# Patient Record
Sex: Female | Born: 1960 | Race: Black or African American | Hispanic: No | Smoking: Current every day smoker
Health system: Southern US, Community
[De-identification: ages and names within clinical notes are randomized; demographics above are authoritative.]

## PROBLEM LIST (undated history)

## (undated) DIAGNOSIS — M797 Fibromyalgia: Secondary | ICD-10-CM

## (undated) DIAGNOSIS — K449 Diaphragmatic hernia without obstruction or gangrene: Secondary | ICD-10-CM

## (undated) DIAGNOSIS — K219 Gastro-esophageal reflux disease without esophagitis: Secondary | ICD-10-CM

## (undated) HISTORY — DX: Diaphragmatic hernia without obstruction or gangrene: K44.9

## (undated) HISTORY — DX: Gastro-esophageal reflux disease without esophagitis: K21.9

## (undated) HISTORY — DX: Fibromyalgia: M79.7

---

## 2007-01-14 ENCOUNTER — Encounter: Admission: RE | Admit: 2007-01-14 | Discharge: 2007-01-14 | Payer: Self-pay | Admitting: Internal Medicine

## 2007-04-05 ENCOUNTER — Ambulatory Visit (HOSPITAL_COMMUNITY): Admission: RE | Admit: 2007-04-05 | Discharge: 2007-04-05 | Payer: Self-pay | Admitting: *Deleted

## 2007-04-05 ENCOUNTER — Encounter (INDEPENDENT_AMBULATORY_CARE_PROVIDER_SITE_OTHER): Payer: Self-pay | Admitting: *Deleted

## 2007-06-08 ENCOUNTER — Ambulatory Visit (HOSPITAL_COMMUNITY): Admission: RE | Admit: 2007-06-08 | Discharge: 2007-06-08 | Payer: Self-pay | Admitting: Internal Medicine

## 2007-09-14 ENCOUNTER — Emergency Department (HOSPITAL_COMMUNITY): Admission: EM | Admit: 2007-09-14 | Discharge: 2007-09-15 | Payer: Self-pay | Admitting: Emergency Medicine

## 2007-11-26 ENCOUNTER — Encounter: Admission: RE | Admit: 2007-11-26 | Discharge: 2007-11-26 | Payer: Self-pay | Admitting: *Deleted

## 2008-03-19 ENCOUNTER — Emergency Department (HOSPITAL_COMMUNITY): Admission: EM | Admit: 2008-03-19 | Discharge: 2008-03-19 | Payer: Self-pay | Admitting: Emergency Medicine

## 2008-06-08 ENCOUNTER — Ambulatory Visit (HOSPITAL_COMMUNITY): Admission: RE | Admit: 2008-06-08 | Discharge: 2008-06-08 | Payer: Self-pay | Admitting: Internal Medicine

## 2008-08-09 ENCOUNTER — Encounter: Admission: RE | Admit: 2008-08-09 | Discharge: 2008-08-09 | Payer: Self-pay | Admitting: Internal Medicine

## 2008-09-04 ENCOUNTER — Emergency Department (HOSPITAL_COMMUNITY): Admission: EM | Admit: 2008-09-04 | Discharge: 2008-09-05 | Payer: Self-pay | Admitting: *Deleted

## 2009-05-08 IMAGING — CT CT ABDOMEN W/ CM
2 of 5 series · 17 of 46 positions shown, 19 images · IV contrast (READICAT/WATER & [ID] OMNI 300)
Comparison: None.

CLINICAL DATA: Left upper quadrant epigastric abdominal pain 4 months.  Weight loss with constipation.  Prior hysterectomy and appendectomy. 
ABDOMEN CT WITH CONTRAST:
TECHNIQUE: Multidetector CT imaging of the abdomen was performed following the standard protocol during bolus administration of intravenous contrast.
Contrast:  100 cc Omnipaque 300
TECHNIQUE: Multidetector CT imaging of the pelvis was performed following the standard protocol during bolus administration of intravenous contrast.

[Series 3: routine abdomen · axial · 0.64mm/px · z∈[-297,+28]mm · 14 of 74 slices shown, 16 images]
[im 5/74  soft-tissue]
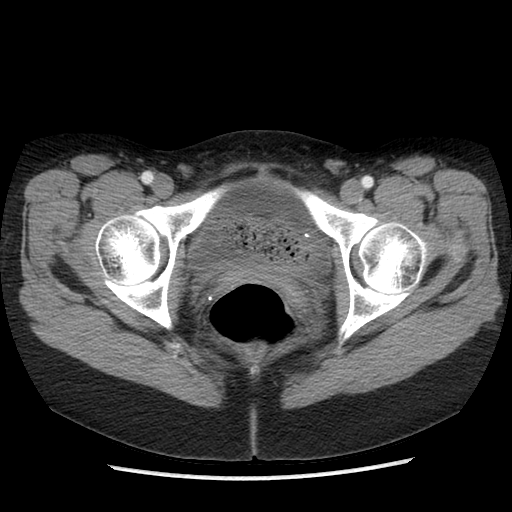
[im 5/74  bone]
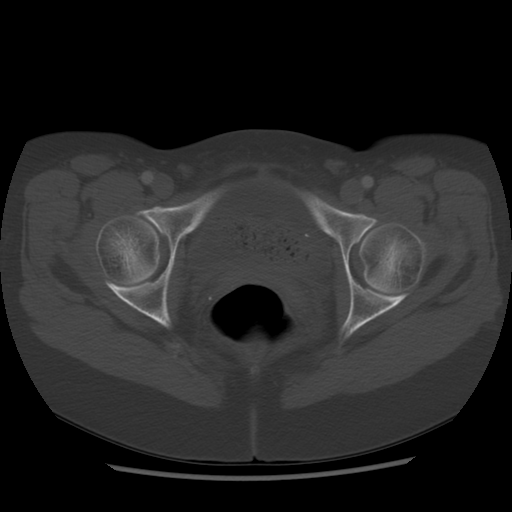
[im 9/74  soft-tissue]
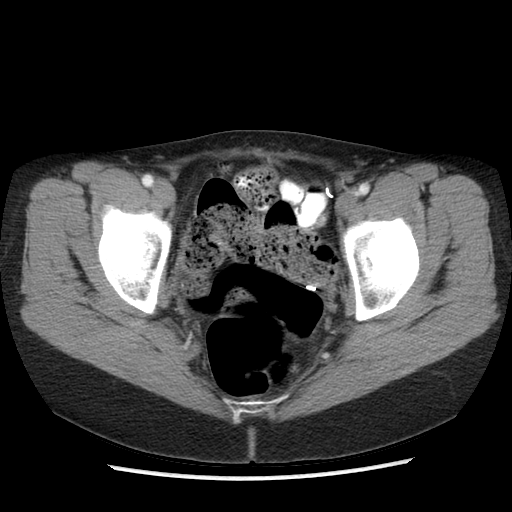
[im 13/74  soft-tissue]
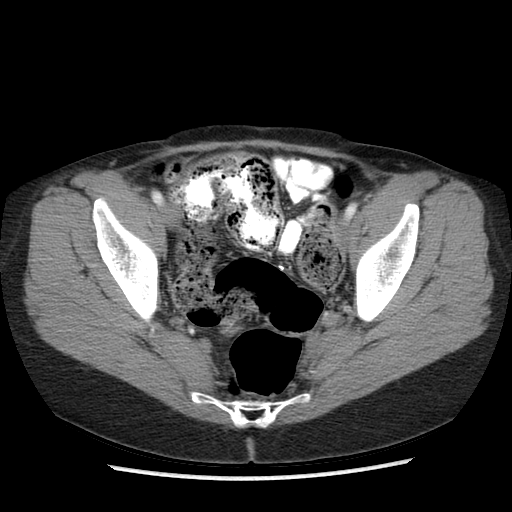
[im 22/74  soft-tissue]
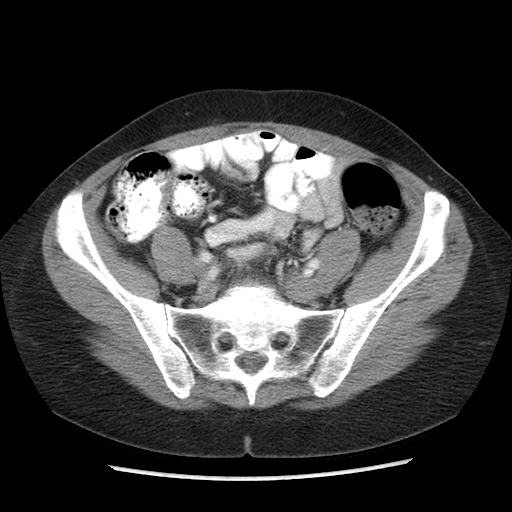
[im 26/74  soft-tissue]
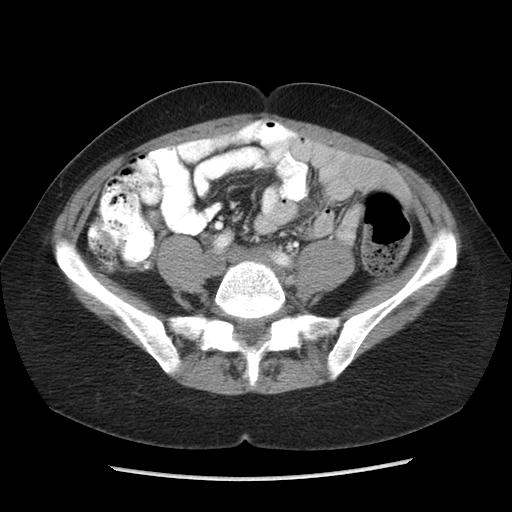
[im 31/74  soft-tissue]
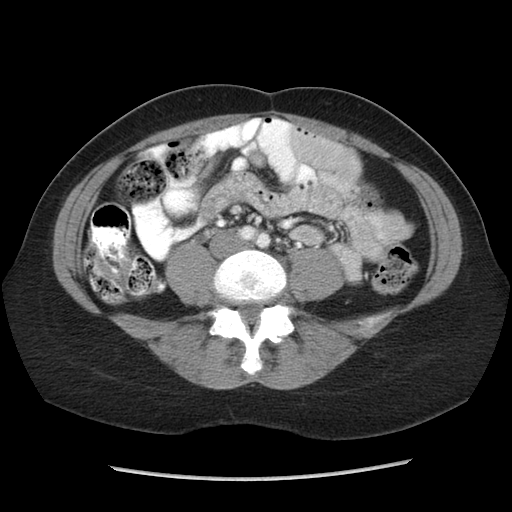
[im 35/74  soft-tissue]
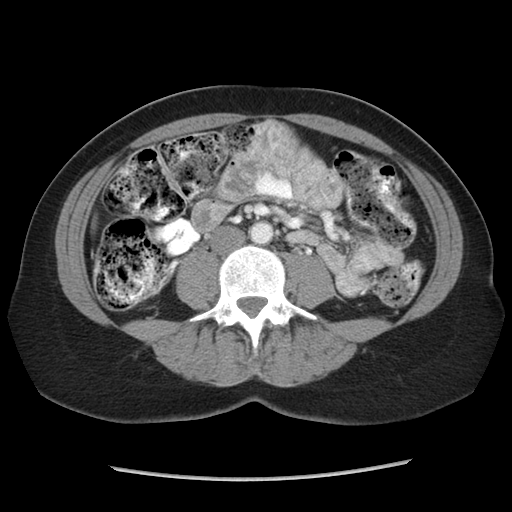
[im 39/74  soft-tissue]
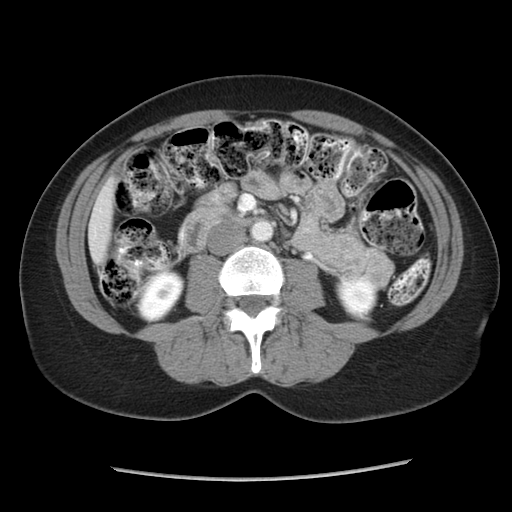
[im 43/74  soft-tissue]
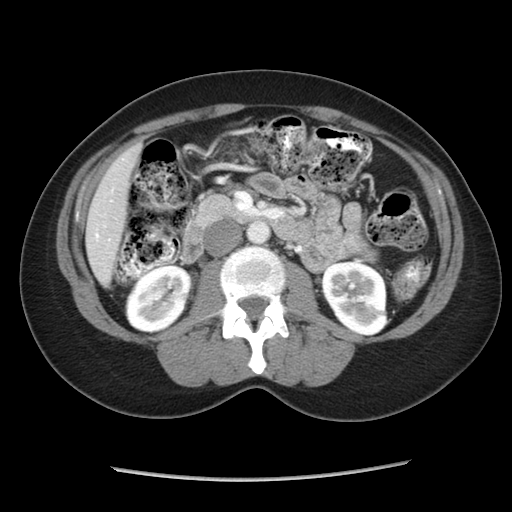
[im 43/74  bone]
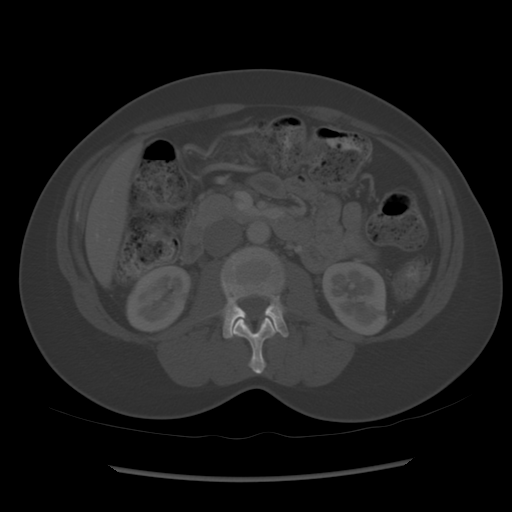
[im 48/74  soft-tissue]
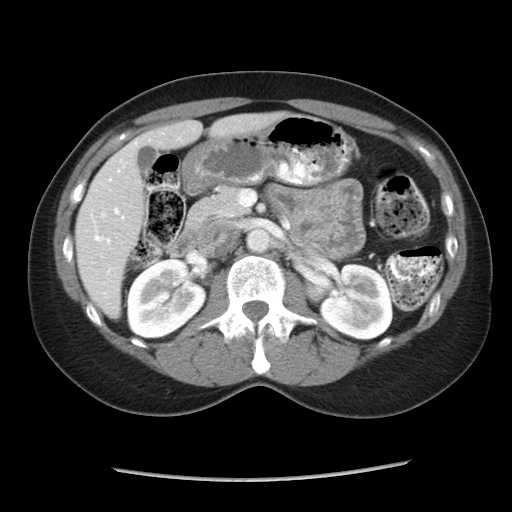
[im 56/74  soft-tissue]
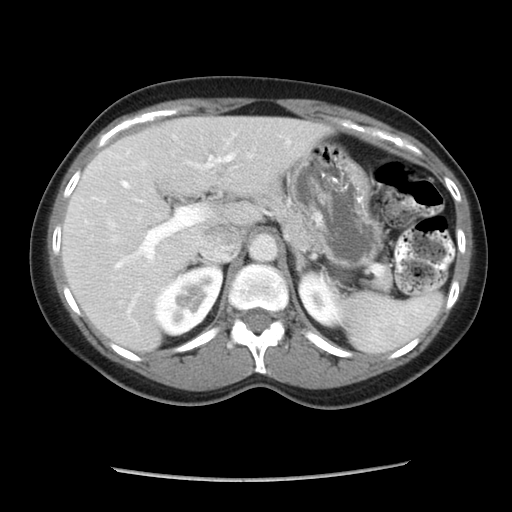
[im 61/74  soft-tissue]
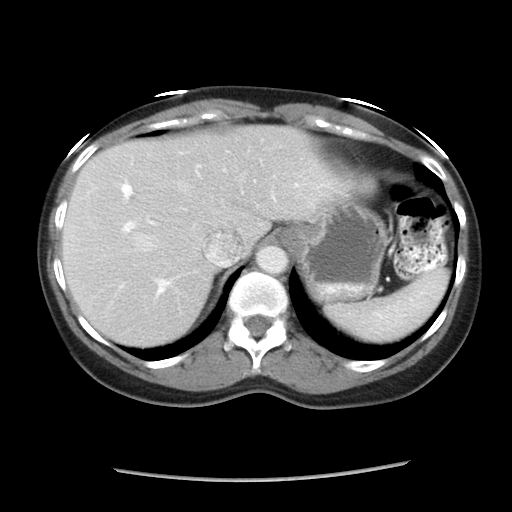
[im 65/74  soft-tissue]
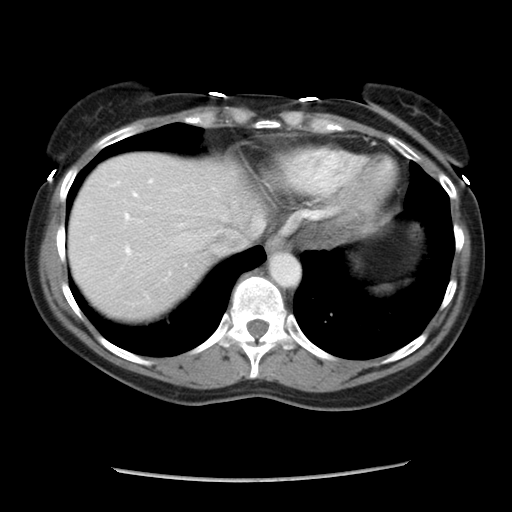
[im 69/74  soft-tissue]
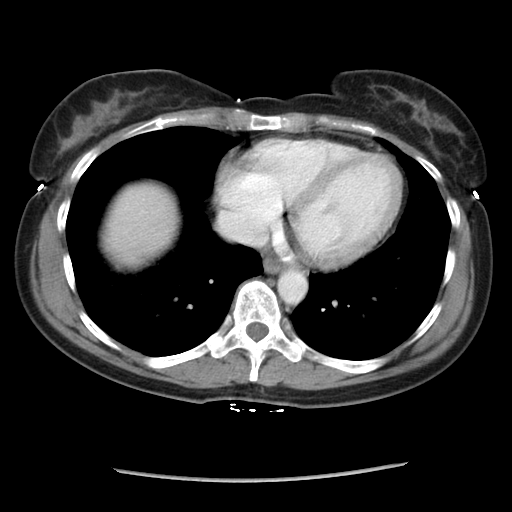

[Series 602: sagittal body · sagittal · 0.82mm/px · 3 of 133 slices shown]
[im 45/133  soft-tissue]
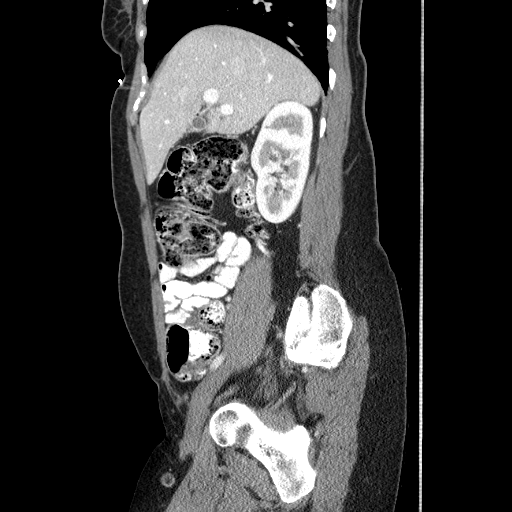
[im 59/133  soft-tissue]
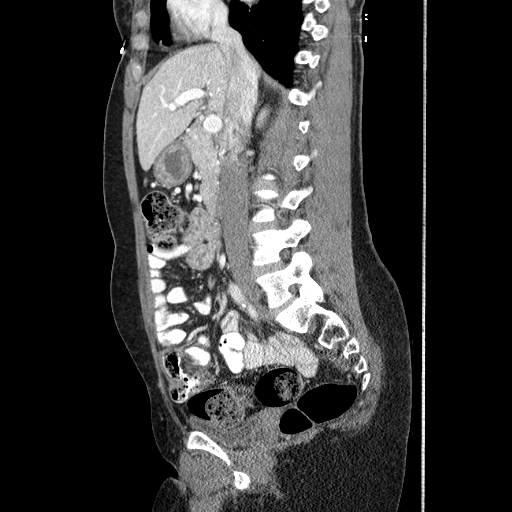
[im 74/133  soft-tissue]
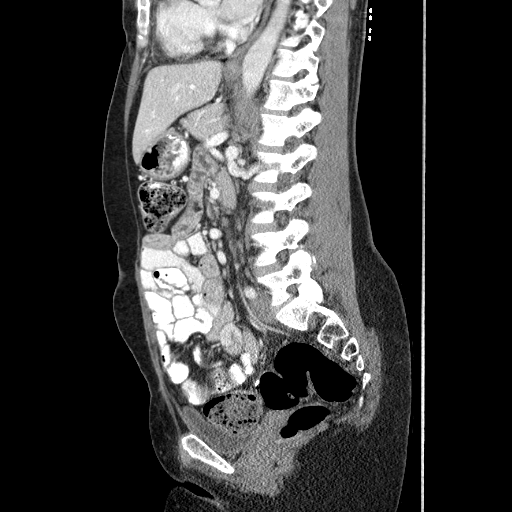

[17 of 46 positions shown; findings below may reference images not displayed]

FINDINGS: Lung bases are essentially clear with normal heart size.  Constipation findings are seen without inflammatory bowel nor intestinal obstructive findings.  Minimal atheromatous vascular calcification of abdominal aorta of normal caliber noted.  Duplicated right renal collecting system and proximal ureter seen.  Remaining abdominal organs appear normal without inflammation, free fluid, nor adenopathy.   Benign-appearing sclerosis is seen at the anterior cortex L-2 vertebra measuring 13 mm long x 4 mm AP x 5 mm wide.
IMPRESSION: 1.  Constipation findings without inflammatory bowel nor intestinal obstructive findings. 
2.  Minimal atheromatous vascular calcification normal caliber abdominal aorta. 
3.  Duplicated right renal collecting system and ureter. 
4.  Otherwise no significant abnormality. 
PELVIS CT WITH CONTRAST:
FINDINGS: Constipation findings are seen without inflammatory nor obstructive intestinal findings.  Patient is postappendectomy and hysterectomy by history and CT exam.  Remaining pelvic organs appear normal without inflammation, free fluid, nor adenopathy.
IMPRESSION: 1.  Postappendectomy and hysterectomy.
2.  Constipation findings. 
3.  Otherwise negative.

## 2010-02-13 ENCOUNTER — Emergency Department (HOSPITAL_COMMUNITY): Admission: EM | Admit: 2010-02-13 | Discharge: 2010-02-13 | Payer: Self-pay | Admitting: Family Medicine

## 2010-05-20 ENCOUNTER — Other Ambulatory Visit
Admission: RE | Admit: 2010-05-20 | Discharge: 2010-05-20 | Payer: Self-pay | Source: Home / Self Care | Admitting: Family Medicine

## 2010-07-18 LAB — DIFFERENTIAL
Basophils Absolute: 0 10*3/uL (ref 0.0–0.1)
Basophils Relative: 0 % (ref 0–1)
Neutro Abs: 4.6 10*3/uL (ref 1.7–7.7)
Neutrophils Relative %: 58 % (ref 43–77)

## 2010-07-18 LAB — COMPREHENSIVE METABOLIC PANEL
Alkaline Phosphatase: 78 U/L (ref 39–117)
BUN: 4 mg/dL — ABNORMAL LOW (ref 6–23)
CO2: 30 mEq/L (ref 19–32)
Chloride: 107 mEq/L (ref 96–112)
Glucose, Bld: 91 mg/dL (ref 70–99)
Potassium: 3.9 mEq/L (ref 3.5–5.1)
Total Bilirubin: 0.5 mg/dL (ref 0.3–1.2)

## 2010-07-18 LAB — POCT URINALYSIS DIPSTICK
Glucose, UA: NEGATIVE mg/dL
Hgb urine dipstick: NEGATIVE
Nitrite: NEGATIVE
Protein, ur: NEGATIVE mg/dL
Urobilinogen, UA: 0.2 mg/dL (ref 0.0–1.0)

## 2010-07-18 LAB — CBC
HCT: 42.1 % (ref 36.0–46.0)
MCV: 87.5 fL (ref 78.0–100.0)
RBC: 4.81 MIL/uL (ref 3.87–5.11)
RDW: 13.7 % (ref 11.5–15.5)
WBC: 8.1 10*3/uL (ref 4.0–10.5)

## 2010-07-18 LAB — POCT H PYLORI SCREEN: H. PYLORI SCREEN, POC: NEGATIVE

## 2010-08-19 ENCOUNTER — Other Ambulatory Visit (HOSPITAL_COMMUNITY): Payer: Self-pay | Admitting: Family Medicine

## 2010-08-19 DIAGNOSIS — Z1231 Encounter for screening mammogram for malignant neoplasm of breast: Secondary | ICD-10-CM

## 2010-08-23 ENCOUNTER — Ambulatory Visit (HOSPITAL_COMMUNITY)
Admission: RE | Admit: 2010-08-23 | Discharge: 2010-08-23 | Disposition: A | Discharge status: Home / Self Care | Payer: BLUE CROSS/BLUE SHIELD | Source: Ambulatory Visit | Attending: Family Medicine | Admitting: Family Medicine

## 2010-08-23 DIAGNOSIS — Z1231 Encounter for screening mammogram for malignant neoplasm of breast: Secondary | ICD-10-CM | POA: Insufficient documentation

## 2010-09-17 NOTE — Op Note (Signed)
Ashley Gould, FAITH               ACCOUNT NO.:  [id number]   MEDICAL RECORD NO.:  [id number]          PATIENT TYPE:  AMB   LOCATION:  ENDO                         FACILITY:  [facility]   PHYSICIAN:  Georgiana Spinner, M.D.    DATE OF BIRTH:  February 26, 1961   DATE OF PROCEDURE:  04/05/2007  DATE OF DISCHARGE:                               OPERATIVE REPORT   PROCEDURE:  Upper endoscopy.   INDICATIONS:  GERD.  History elsewhere of Barrett's esophagus.   ANESTHESIA:  1. Fentanyl 50 mcg.  2. Versed 4 mg.   PROCEDURE:  With the patient mildly sedated in the left lateral  decubitus position, the Pentax videoscopic endoscope was inserted into  the mouth and passed under direct vision through the esophagus, which  appeared normal.  There was a hiatal hernia seen, but no evidence of  Barrett's that I could tell.  Photographs and biopsy of the  squamocolumnar junction were taken.  We entered into the stomach.  The  fundus, body, and antrum appeared normal.  Duodenal bulb showed some  changes that were photographed and biopsied as well.  The second portion  of the duodenum appeared normal.  From this point, the endoscope was  slowly withdrawn, taking circumferential views of the duodenal mucosa  until the endoscope had been pulled back into the stomach and placed in  retroflexion to view the stomach from below. The endoscope was  straightened and withdrawn, taking circumferential views of the  remaining gastric and esophageal mucosa.  The patient's vital signs and  pulse oximeter remained stable.  The patient tolerated the procedure  well, without apparent complication.   FINDINGS:  Hiatal hernia, with no evidence of Barrett's esophagus to my  view, and changes in the duodenal bulb as noted.  Await biopsy report.  The patient will call me for results and follow up with me as needed as  an outpatient.           ______________________________  Georgiana Spinner, M.D.     GMO/MEDQ  D:  04/05/2007  T:   04/05/2007  Job:  [id number]

## 2011-04-01 ENCOUNTER — Other Ambulatory Visit: Payer: Self-pay | Admitting: Family Medicine

## 2011-04-03 ENCOUNTER — Ambulatory Visit
Admission: RE | Admit: 2011-04-03 | Discharge: 2011-04-03 | Disposition: A | Discharge status: Home / Self Care | Payer: BC Managed Care – PPO | Source: Ambulatory Visit | Attending: Family Medicine | Admitting: Family Medicine

## 2011-04-03 MED ORDER — IOHEXOL 300 MG/ML  SOLN
100.0000 mL | Freq: Once | INTRAMUSCULAR | Status: AC | PRN
  Administered 2011-04-03: 100 mL via INTRAVENOUS

## 2011-08-18 ENCOUNTER — Emergency Department (HOSPITAL_COMMUNITY): Payer: BC Managed Care – PPO

## 2011-08-18 ENCOUNTER — Emergency Department (HOSPITAL_COMMUNITY)
Admission: EM | Admit: 2011-08-18 | Discharge: 2011-08-19 | Disposition: A | Discharge status: Home / Self Care | Payer: BC Managed Care – PPO | Attending: Emergency Medicine | Admitting: Emergency Medicine

## 2011-08-18 ENCOUNTER — Encounter (HOSPITAL_COMMUNITY): Payer: Self-pay | Admitting: *Deleted

## 2011-08-18 DIAGNOSIS — R110 Nausea: Secondary | ICD-10-CM | POA: Insufficient documentation

## 2011-08-18 DIAGNOSIS — R1011 Right upper quadrant pain: Secondary | ICD-10-CM | POA: Insufficient documentation

## 2011-08-18 DIAGNOSIS — R079 Chest pain, unspecified: Secondary | ICD-10-CM | POA: Insufficient documentation

## 2011-08-18 DIAGNOSIS — R1013 Epigastric pain: Secondary | ICD-10-CM | POA: Insufficient documentation

## 2011-08-18 DIAGNOSIS — R55 Syncope and collapse: Secondary | ICD-10-CM | POA: Insufficient documentation

## 2011-08-18 DIAGNOSIS — R61 Generalized hyperhidrosis: Secondary | ICD-10-CM | POA: Insufficient documentation

## 2011-08-18 DIAGNOSIS — R42 Dizziness and giddiness: Secondary | ICD-10-CM | POA: Insufficient documentation

## 2011-08-18 DIAGNOSIS — R10819 Abdominal tenderness, unspecified site: Secondary | ICD-10-CM | POA: Insufficient documentation

## 2011-08-18 DIAGNOSIS — F17200 Nicotine dependence, unspecified, uncomplicated: Secondary | ICD-10-CM | POA: Insufficient documentation

## 2011-08-18 LAB — COMPREHENSIVE METABOLIC PANEL
AST: 17 U/L (ref 0–37)
Albumin: 4.2 g/dL (ref 3.5–5.2)
Alkaline Phosphatase: 85 U/L (ref 39–117)
CO2: 30 mEq/L (ref 19–32)
Chloride: 103 mEq/L (ref 96–112)
Creatinine, Ser: 0.82 mg/dL (ref 0.50–1.10)
GFR calc non Af Amer: 80 mL/min — ABNORMAL LOW (ref >90)
Potassium: 4 mEq/L (ref 3.5–5.1)
Total Bilirubin: 0.3 mg/dL (ref 0.3–1.2)

## 2011-08-18 LAB — CBC
HCT: 43.1 % (ref 36.0–46.0)
Hemoglobin: 13.8 g/dL (ref 12.0–15.0)
WBC: 8 10*3/uL (ref 4.0–10.5)

## 2011-08-18 LAB — POCT I-STAT TROPONIN I: Troponin i, poc: 0 ng/mL (ref 0.00–0.08)

## 2011-08-18 LAB — DIFFERENTIAL
Basophils Absolute: 0 10*3/uL (ref 0.0–0.1)
Lymphocytes Relative: 44 % (ref 12–46)
Monocytes Absolute: 0.4 10*3/uL (ref 0.1–1.0)
Monocytes Relative: 5 % (ref 3–12)
Neutro Abs: 3.9 10*3/uL (ref 1.7–7.7)
Neutrophils Relative %: 49 % (ref 43–77)

## 2011-08-18 LAB — CK TOTAL AND CKMB (NOT AT ARMC): CK, MB: 1.1 ng/mL (ref 0.3–4.0)

## 2011-08-18 MED ORDER — ONDANSETRON 4 MG PO TBDP
8.0000 mg | ORAL_TABLET | Freq: Once | ORAL | Status: AC
  Administered 2011-08-18: 8 mg via ORAL

## 2011-08-18 NOTE — ED Provider Notes (Signed)
History     CSN: [id number]  Arrival date & time 08/18/11  1825   None     Chief Complaint  Patient presents with  . Chest Pain    (Consider location/radiation/quality/duration/timing/severity/associated sxs/prior treatment) Patient is a 52 y.o. female presenting with chest pain. The history is provided by the patient. No language interpreter was used.  Chest Pain The chest pain began 2 days ago. Chest pain occurs intermittently. The chest pain is worsening. At its most intense, the pain is at 8/10. The pain is currently at 5/10. The severity of the pain is moderate. The quality of the pain is described as pressure-like. The pain radiates to the epigastrium. Chest pain is worsened by eating. Primary symptoms include abdominal pain, nausea and dizziness. Pertinent negatives for primary symptoms include no fever, no shortness of breath, no cough, no wheezing and no vomiting.  Dizziness also occurs with nausea and diaphoresis. Dizziness does not occur with vomiting or weakness.   Associated symptoms include diaphoresis and near-syncope.  Pertinent negatives for associated symptoms include no lower extremity edema and no weakness.  Her past medical history is significant for anxiety/panic attacks.  Pertinent negatives for past medical history include no aneurysm, no aortic aneurysm, no aortic dissection, no CAD, no cancer, no COPD, no CHF, no diabetes, no DVT, no hypertension, no MI, no mitral valve prolapse, no pacemaker, no PE, no recent injury, no rheumatic fever, no seizures, no sickle cell disease, no sleep apnea, no strokes, no thyroid problem, no TIA and no valve disorder.  Her family medical history is significant for diabetes in family, hyperlipidemia in family and hypertension in family.  Pertinent negatives for family medical history include: no CAD in family, no heart disease in family, no early MI in family, no PE in family, no sickle cell disease in family, no stroke in family, no  sudden death in family and no TIA in family.  Procedure history is positive for stress echo and exercise treadmill test.  Procedure history is negative for cardiac catheterization, echocardiogram, persantine thallium and stress thallium.    51 year old female complaining of right upper quadrant pain that radiates into her chest. States the pain has been on a daily basis for 2 weeks. States that the pain may last a few minutes or it can last up to an hour. States that she has had nausea with this pain no vomiting or shortness of breath. Describes the pain as a pressure. Patient is a smoker.  Patient's does still have her gallbladder. Refusing pain meds.  pmh fibromyalia and gerd.    History reviewed. No pertinent past medical history.  History reviewed. No pertinent past surgical history.  No family history on file.  History  Substance Use Topics  . Smoking status: Current Everyday Smoker  . Smokeless tobacco: Not on file  . Alcohol Use: No    OB History    Grav Para Term Preterm Abortions TAB SAB Ect Mult Living                  Review of Systems  Constitutional: Positive for diaphoresis. Negative for fever.  HENT: Negative.   Eyes: Negative.   Respiratory: Negative.  Negative for cough, shortness of breath and wheezing.   Cardiovascular: Positive for chest pain and near-syncope.  Gastrointestinal: Positive for nausea and abdominal pain. Negative for vomiting and abdominal distention.  Genitourinary: Negative for flank pain.  Neurological: Positive for dizziness. Negative for seizures, syncope, facial asymmetry, weakness, light-headedness and headaches.  Psychiatric/Behavioral: Negative.   All other systems reviewed and are negative.    Allergies  Sulfa antibiotics  Home Medications   Current Outpatient Rx  Name Route Sig Dispense Refill  . DULOXETINE HCL 30 MG PO CPEP Oral Take 30 mg by mouth daily.    Marland Kitchen ESOMEPRAZOLE MAGNESIUM 40 MG PO CPDR Oral Take 40 mg by mouth  daily before breakfast.      BP 146/88  Pulse 109  Temp(Src) 98.8 F (37.1 C) (Oral)  Resp 16  SpO2 98%  Physical Exam  Nursing note and vitals reviewed. Constitutional: She is oriented to person, place, and time. She appears well-developed and well-nourished.  HENT:  Head: Normocephalic and atraumatic.  Eyes: Conjunctivae and EOM are normal. Pupils are equal, round, and reactive to light.  Neck: Normal range of motion. Neck supple.  Cardiovascular: Normal rate, regular rhythm, normal heart sounds and intact distal pulses.   Pulmonary/Chest: Effort normal and breath sounds normal. No respiratory distress. She has no wheezes. She has no rales.  Abdominal: Soft. Bowel sounds are normal. She exhibits no distension. There is tenderness. There is no rebound.  Musculoskeletal: Normal range of motion. She exhibits no edema and no tenderness.  Neurological: She is alert and oriented to person, place, and time. She has normal reflexes.  Skin: Skin is warm and dry.  Psychiatric: She has a normal mood and affect.    ED Course  Procedures (including critical care time)  Labs Reviewed  COMPREHENSIVE METABOLIC PANEL - Abnormal; Notable for the following:    GFR calc non Af Amer 80 (*)    All other components within normal limits  LIPASE, BLOOD - Abnormal; Notable for the following:    Lipase 77 (*)    All other components within normal limits  CBC  DIFFERENTIAL  CK TOTAL AND CKMB  POCT I-STAT TROPONIN I   No results found.   No diagnosis found.    MDM  Will move to CDU awaiting u/s abdomen to r/o gallbladder dz.  Elevated liver enzymes with epigastric/chest pain.  Trop - x 2.  Refusing pain meds in ER.  Will follow up with Pcp tomorrow to have lipase rechecked.  Pain med and nausea meds given.  Will return if worse.  U/s - for cholecystitis.  Suspect early pancreatitis. Denies etoh.   Labs Reviewed  COMPREHENSIVE METABOLIC PANEL - Abnormal; Notable for the following:    GFR  calc non Af Amer 80 (*)    All other components within normal limits  LIPASE, BLOOD - Abnormal; Notable for the following:    Lipase 77 (*)    All other components within normal limits  CBC  DIFFERENTIAL  CK TOTAL AND CKMB  POCT I-STAT TROPONIN I  POCT I-STAT TROPONIN I  LAB REPORT - SCANNED          Remi Haggard, NP 08/19/11 1210

## 2011-08-18 NOTE — ED Notes (Signed)
Pt states that she has been having CP off and on for the past couple of days. Pt states that the pain is an 4/10 and center chest radiating to her back pt states that she is not nauseated or SOB or diaphoretic.

## 2011-08-18 NOTE — ED Notes (Signed)
The pt has had  Chest pain epigastric pain with lt posterior chest radiation for over one week

## 2011-08-19 LAB — POCT I-STAT TROPONIN I: Troponin i, poc: 0 ng/mL (ref 0.00–0.08)

## 2011-08-19 MED ORDER — ONDANSETRON HCL 4 MG PO TABS: 4.0000 mg | ORAL_TABLET | Freq: Four times a day (QID) | ORAL | Status: AC

## 2011-08-19 MED ORDER — HYDROCODONE-ACETAMINOPHEN 5-500 MG PO TABS: 1.0000 | ORAL_TABLET | Freq: Four times a day (QID) | ORAL | Status: AC | PRN

## 2011-08-19 NOTE — ED Notes (Signed)
Patient verbalized complete understanding of all the d/c home instructions 

## 2011-08-19 NOTE — Discharge Instructions (Signed)
Ms Crumpler the u/s of your abdomen does not show gallbladder disease.  Take the pain meds and nausea meds and follow up with your pcp tomorrow to have your lipase rechecked.  Your lipase in the ER was elevated at 77.  This could mean you have pancreatitis. Return to the ER for worsening pain or uncontrolled nausea and vomiting.  The EKG and heart labs were ok in the ER tonight.    Abdominal Pain Many things can cause belly (abdominal) pain. Most times, the belly pain is not dangerous. The amount of belly pain does not tell how serious the problem may be. Many cases of belly pain can be watched and treated at home. HOME CARE   Do not take medicines that help you go poop (laxatives) unless told to by your doctor.   Only take medicine as told by your doctor.   Eat or drink as told by your doctor. Your doctor will tell you if you should be on a special diet.  GET HELP RIGHT AWAY IF:   The pain does not go away.   You have a fever.   You keep throwing up (vomiting).   The pain changes and is only in the right or left part of the belly.   You have bloody or tarry looking poop.  MAKE SURE YOU:   Understand these instructions.   Will watch your condition.   Will get help right away if you are not doing well or get worse.  Document Released: 10/08/2007 Document Revised: 04/10/2011 Document Reviewed: 05/07/2009 [facility] Patient Information 2012 [place], [place].Abdominal Pain Many things can cause belly (abdominal) pain. Most times, the belly pain is not dangerous. The amount of belly pain does not tell how serious the problem may be. Many cases of belly pain can be watched and treated at home. HOME CARE   Do not take medicines that help you go poop (laxatives) unless told to by your doctor.   Only take medicine as told by your doctor.   Eat or drink as told by your doctor. Your doctor will tell you if you should be on a special diet.  GET HELP RIGHT AWAY IF:   The pain does not go  away.   You have a fever.   You keep throwing up (vomiting).   The pain changes and is only in the right or left part of the belly.   You have bloody or tarry looking poop.  MAKE SURE YOU:   Understand these instructions.   Will watch your condition.   Will get help right away if you are not doing well or get worse.  Document Released: 10/08/2007 Document Revised: 04/10/2011 Document Reviewed: 05/07/2009 [facility] Patient Information 2012 [place], [place].Abdominal Pain Many things can cause belly (abdominal) pain. Most times, the belly pain is not dangerous. The amount of belly pain does not tell how serious the problem may be. Many cases of belly pain can be watched and treated at home. HOME CARE   Do not take medicines that help you go poop (laxatives) unless told to by your doctor.   Only take medicine as told by your doctor.   Eat or drink as told by your doctor. Your doctor will tell you if you should be on a special diet.  GET HELP RIGHT AWAY IF:   The pain does not go away.   You have a fever.   You keep throwing up (vomiting).   The pain changes and is only in the  right or left part of the belly.   You have bloody or tarry looking poop.  MAKE SURE YOU:   Understand these instructions.   Will watch your condition.   Will get help right away if you are not doing well or get worse.  Document Released: 10/08/2007 Document Revised: 04/10/2011 Document Reviewed: 05/07/2009 [facility] Patient Information 2012 [place], [place].

## 2011-08-22 ENCOUNTER — Other Ambulatory Visit: Payer: Self-pay | Admitting: Gastroenterology

## 2011-08-22 NOTE — ED Provider Notes (Signed)
Medical screening examination/treatment/procedure(s) were performed by non-physician practitioner and as supervising physician I was immediately available for consultation/collaboration.   Laray Anger, DO 08/22/11 2221

## 2011-08-28 ENCOUNTER — Other Ambulatory Visit: Payer: No Typology Code available for payment source

## 2011-08-28 ENCOUNTER — Ambulatory Visit
Admission: RE | Admit: 2011-08-28 | Discharge: 2011-08-28 | Disposition: A | Discharge status: Home / Self Care | Payer: BC Managed Care – PPO | Source: Ambulatory Visit | Attending: Gastroenterology | Admitting: Gastroenterology

## 2011-08-28 MED ORDER — GADOBENATE DIMEGLUMINE 529 MG/ML IV SOLN
13.0000 mL | Freq: Once | INTRAVENOUS | Status: AC | PRN
  Administered 2011-08-28: 13 mL via INTRAVENOUS

## 2012-02-10 ENCOUNTER — Other Ambulatory Visit (HOSPITAL_COMMUNITY): Payer: Self-pay | Admitting: Family Medicine

## 2012-02-10 DIAGNOSIS — Z1231 Encounter for screening mammogram for malignant neoplasm of breast: Secondary | ICD-10-CM

## 2012-02-20 ENCOUNTER — Ambulatory Visit (HOSPITAL_COMMUNITY): Payer: BC Managed Care – PPO

## 2012-03-05 ENCOUNTER — Other Ambulatory Visit: Payer: Self-pay | Admitting: Family Medicine

## 2012-03-05 ENCOUNTER — Ambulatory Visit (HOSPITAL_COMMUNITY)
Admission: RE | Admit: 2012-03-05 | Discharge: 2012-03-05 | Disposition: A | Discharge status: Home / Self Care | Payer: BC Managed Care – PPO | Source: Ambulatory Visit | Attending: Family Medicine | Admitting: Family Medicine

## 2012-03-05 DIAGNOSIS — N63 Unspecified lump in unspecified breast: Secondary | ICD-10-CM

## 2012-03-05 DIAGNOSIS — Z1231 Encounter for screening mammogram for malignant neoplasm of breast: Secondary | ICD-10-CM

## 2012-03-17 ENCOUNTER — Ambulatory Visit
Admission: RE | Admit: 2012-03-17 | Discharge: 2012-03-17 | Disposition: A | Discharge status: Home / Self Care | Payer: BC Managed Care – PPO | Source: Ambulatory Visit | Attending: Family Medicine | Admitting: Family Medicine

## 2012-03-17 ENCOUNTER — Other Ambulatory Visit: Payer: Self-pay | Admitting: Family Medicine

## 2012-03-17 DIAGNOSIS — N63 Unspecified lump in unspecified breast: Secondary | ICD-10-CM

## 2012-05-24 ENCOUNTER — Ambulatory Visit
Admission: RE | Admit: 2012-05-24 | Discharge: 2012-05-24 | Disposition: A | Discharge status: Home / Self Care | Payer: BC Managed Care – PPO | Source: Ambulatory Visit | Attending: Family Medicine | Admitting: Family Medicine

## 2012-05-24 ENCOUNTER — Other Ambulatory Visit: Payer: Self-pay | Admitting: Family Medicine

## 2012-05-24 DIAGNOSIS — R1013 Epigastric pain: Secondary | ICD-10-CM

## 2012-05-24 DIAGNOSIS — R109 Unspecified abdominal pain: Secondary | ICD-10-CM

## 2012-05-24 MED ORDER — IOHEXOL 300 MG/ML  SOLN
100.0000 mL | Freq: Once | INTRAMUSCULAR | Status: AC | PRN
  Administered 2012-05-24: 100 mL via INTRAVENOUS

## 2012-05-24 MED ORDER — IOHEXOL 300 MG/ML  SOLN
30.0000 mL | Freq: Once | INTRAMUSCULAR | Status: AC | PRN
  Administered 2012-05-24: 30 mL via ORAL

## 2013-02-24 ENCOUNTER — Other Ambulatory Visit (HOSPITAL_COMMUNITY): Payer: Self-pay | Admitting: Family Medicine

## 2013-02-24 DIAGNOSIS — Z1231 Encounter for screening mammogram for malignant neoplasm of breast: Secondary | ICD-10-CM

## 2013-03-18 ENCOUNTER — Ambulatory Visit (HOSPITAL_COMMUNITY): Payer: BC Managed Care – PPO | Attending: Family Medicine

## 2013-03-23 ENCOUNTER — Encounter (HOSPITAL_COMMUNITY): Payer: Self-pay | Admitting: Emergency Medicine

## 2013-03-23 ENCOUNTER — Emergency Department (HOSPITAL_COMMUNITY): Payer: BC Managed Care – PPO

## 2013-03-23 ENCOUNTER — Emergency Department (HOSPITAL_COMMUNITY)
Admission: EM | Admit: 2013-03-23 | Discharge: 2013-03-23 | Disposition: A | Discharge status: Home / Self Care | Payer: BC Managed Care – PPO | Attending: Emergency Medicine | Admitting: Emergency Medicine

## 2013-03-23 DIAGNOSIS — Y929 Unspecified place or not applicable: Secondary | ICD-10-CM | POA: Insufficient documentation

## 2013-03-23 DIAGNOSIS — S46909A Unspecified injury of unspecified muscle, fascia and tendon at shoulder and upper arm level, unspecified arm, initial encounter: Secondary | ICD-10-CM | POA: Insufficient documentation

## 2013-03-23 DIAGNOSIS — Z23 Encounter for immunization: Secondary | ICD-10-CM | POA: Insufficient documentation

## 2013-03-23 DIAGNOSIS — S0993XA Unspecified injury of face, initial encounter: Secondary | ICD-10-CM | POA: Insufficient documentation

## 2013-03-23 DIAGNOSIS — S0990XA Unspecified injury of head, initial encounter: Secondary | ICD-10-CM

## 2013-03-23 DIAGNOSIS — S0180XA Unspecified open wound of other part of head, initial encounter: Secondary | ICD-10-CM | POA: Insufficient documentation

## 2013-03-23 DIAGNOSIS — Y9389 Activity, other specified: Secondary | ICD-10-CM | POA: Insufficient documentation

## 2013-03-23 DIAGNOSIS — S0181XA Laceration without foreign body of other part of head, initial encounter: Secondary | ICD-10-CM

## 2013-03-23 DIAGNOSIS — W1809XA Striking against other object with subsequent fall, initial encounter: Secondary | ICD-10-CM | POA: Insufficient documentation

## 2013-03-23 DIAGNOSIS — S4980XA Other specified injuries of shoulder and upper arm, unspecified arm, initial encounter: Secondary | ICD-10-CM | POA: Insufficient documentation

## 2013-03-23 DIAGNOSIS — F17200 Nicotine dependence, unspecified, uncomplicated: Secondary | ICD-10-CM | POA: Insufficient documentation

## 2013-03-23 MED ORDER — OXYCODONE-ACETAMINOPHEN 5-325 MG PO TABS
2.0000 | ORAL_TABLET | Freq: Once | ORAL | Status: AC
  Administered 2013-03-23: 2 via ORAL
  Filled 2013-03-23: qty 2

## 2013-03-23 MED ORDER — IBUPROFEN 200 MG PO TABS
600.0000 mg | ORAL_TABLET | Freq: Once | ORAL | Status: AC
  Administered 2013-03-23: 600 mg via ORAL
  Filled 2013-03-23: qty 3

## 2013-03-23 MED ORDER — TETANUS-DIPHTH-ACELL PERTUSSIS 5-2.5-18.5 LF-MCG/0.5 IM SUSP
0.5000 mL | Freq: Once | INTRAMUSCULAR | Status: AC
  Administered 2013-03-23: 0.5 mL via INTRAMUSCULAR
  Filled 2013-03-23: qty 0.5

## 2013-03-23 MED ORDER — OXYCODONE-ACETAMINOPHEN 5-325 MG PO TABS: 1.0000 | ORAL_TABLET | ORAL | Status: DC | PRN

## 2013-03-23 NOTE — ED Notes (Signed)
The pt fell getting out of the bath tub  Just pta.  Small lac just above her lt eyebrow.  C/o neck and rt shoulder pain since then.  No loc. She does not remember now if she tripped or what happened.

## 2013-03-23 NOTE — ED Notes (Signed)
Collar removed via MD approval.

## 2013-03-23 NOTE — ED Notes (Signed)
c-collar applied at triage 

## 2013-03-29 NOTE — ED Provider Notes (Signed)
CSN: [id number]     Arrival date & time 03/23/13  1944 History   First MD Initiated Contact with Patient 03/23/13 2034     Chief Complaint  Patient presents with  . Fall   (Consider location/radiation/quality/duration/timing/severity/associated sxs/prior Treatment) HPI 52 year old female presenting after fall. Patient fell as she was getting out of the tub just before arrival. She struck her head. Soft tissue is consciousness. Is complaining of mild headache, neck and some shoulder pain. A small laceration. No use of blood thinning medication. No acute visual changes. No nausea or vomiting. No acute numbness, tingling or loss of strength. Unsure her last tetanus. No dizziness or lightheadedness. No intervention prior to arrival.  History reviewed. No pertinent past medical history. History reviewed. No pertinent past surgical history. No family history on file. History  Substance Use Topics  . Smoking status: Current Every Day Smoker  . Smokeless tobacco: Not on file  . Alcohol Use: No   OB History   Grav Para Term Preterm Abortions TAB SAB Ect Mult Living                 Review of Systems  All systems reviewed and negative, other than as noted in HPI.   Allergies  Sulfa antibiotics  Home Medications   Current Outpatient Rx  Name  Route  Sig  Dispense  Refill  . oxyCODONE-acetaminophen (PERCOCET/ROXICET) 5-325 MG per tablet   Oral   Take 1-2 tablets by mouth every 4 (four) hours as needed for severe pain.   12 tablet   0    BP 149/81  Pulse 92  Temp(Src) 97.9 F (36.6 C) (Oral)  Resp 16  Wt 156 lb 3.2 oz (70.852 kg)  SpO2 96% Physical Exam  Nursing note and vitals reviewed. Constitutional: She is oriented to person, place, and time. She appears well-developed and well-nourished. No distress.  HENT:  Head: Normocephalic.  Approximately 3 cm laceration to left forhead. No active bleeding. mild localized tenderness. Cervical collar. No midline spinal tenderness.   Eyes: Conjunctivae are normal. Right eye exhibits no discharge. Left eye exhibits no discharge.  Neck: Neck supple.  Cardiovascular: Normal rate, regular rhythm and normal heart sounds.  Exam reveals no gallop and no friction rub.   No murmur heard. Pulmonary/Chest: Effort normal and breath sounds normal. No respiratory distress.  Abdominal: Soft. She exhibits no distension. There is no tenderness.  Musculoskeletal: She exhibits no edema and no tenderness.  2 bony tenderness the extremities. No apparent pain with range of motion of the large joints.  Neurological: She is alert and oriented to person, place, and time. No cranial nerve deficit. She exhibits normal muscle tone. Coordination normal.  Good finger to nose testing bilaterally  Skin: Skin is warm and dry. She is not diaphoretic.  Psychiatric: She has a normal mood and affect. Her behavior is normal. Thought content normal.    ED Course  Procedures (including critical care time)  LACERATION REPAIR Performed by: Raeford Razor Authorized by: Raeford Razor Consent: Verbal consent obtained. Risks and benefits: risks, benefits and alternatives were discussed Consent given by: patient Patient identity confirmed: provided demographic data Prepped and Draped in normal sterile fashion Wound explored  Laceration Location: L forehead  Laceration Length: 3cm  No Foreign Bodies seen or palpated  Anesthesia: local infiltration  Local anesthetic: lidocaine 1% wepinephrine  Anesthetic total: 3 ml  Irrigation method: syringe Amount of cleaning: standard  Skin closure: 6-0 prolene  Number of sutures: 1  Technique: running  Patient tolerance: Patient tolerated the procedure well with no immediate complications.  Labs Review Labs Reviewed - No data to display Imaging Review No results found.  EKG Interpretation    Date/Time:  Wednesday March 23 2013 20:05:41 EST Ventricular Rate:  91 PR Interval:  128 QRS  Duration: 70 QT Interval:  350 QTC Calculation: 430 R Axis:   82 Text Interpretation:  Normal sinus rhythm Normal ECG ED PHYSICIAN INTERPRETATION AVAILABLE IN [facility] Confirmed by TEST, RECORD ([id number]) on 03/25/2013 9:13:37 AM            MDM   1. Head injury, initial encounter   2. Facial laceration, initial encounter      52 year old female sitting after mechanical fall. Nonfocal neurological examination. Imaging without evidence of significant injury. Small facial laceration which was repaired. Head injury instructions and continued wound care was discussed. Emergent return precautions were discussed. Outpatient followup as needed and for suture removal.    Raeford Razor, MD 03/29/13 1421

## 2013-04-18 ENCOUNTER — Ambulatory Visit (INDEPENDENT_AMBULATORY_CARE_PROVIDER_SITE_OTHER): Payer: BC Managed Care – PPO | Admitting: Cardiovascular Disease

## 2013-04-18 ENCOUNTER — Encounter: Payer: Self-pay | Admitting: Cardiology

## 2013-04-18 ENCOUNTER — Encounter: Payer: Self-pay | Admitting: *Deleted

## 2013-04-18 VITALS — BP 134/80 | HR 92 | Ht 64.0 in | Wt 157.1 lb

## 2013-04-18 DIAGNOSIS — R55 Syncope and collapse: Secondary | ICD-10-CM

## 2013-04-18 DIAGNOSIS — M797 Fibromyalgia: Secondary | ICD-10-CM | POA: Insufficient documentation

## 2013-04-18 DIAGNOSIS — IMO0001 Reserved for inherently not codable concepts without codable children: Secondary | ICD-10-CM

## 2013-04-18 DIAGNOSIS — K449 Diaphragmatic hernia without obstruction or gangrene: Secondary | ICD-10-CM | POA: Insufficient documentation

## 2013-04-18 DIAGNOSIS — K219 Gastro-esophageal reflux disease without esophagitis: Secondary | ICD-10-CM | POA: Insufficient documentation

## 2013-04-18 NOTE — Assessment & Plan Note (Signed)
Continue cymbalta  

## 2013-04-18 NOTE — Patient Instructions (Signed)
Your physician recommends that you schedule a follow-up appointment in: AS NEEDED  Your physician has requested that you have a stress echocardiogram. For further information please visit [email or web address]. Please follow instruction sheet as given.  

## 2013-04-18 NOTE — Assessment & Plan Note (Signed)
Etiology not clear. May have been vasodilated after shower and tripped with retrograde amnesia from hitting head F/U stress echo to r/o structural heart disease.  No high risk features for cardiogenic syncope on history , exam Or ECG

## 2013-04-18 NOTE — Progress Notes (Signed)
Patient ID: Ashley Gould, female   DOB: 1960-07-21, 52 y.o.   MRN: [id number]  52 yo referred by Dr Cliffton Asters for "syncopal spell"  ER visit said mechanical fall with no neurologic deficits  Had facial laceration repaired.  11/19 was getting out of shower And found herself on floor.  No chest pain palpitations or dyspnea Since fall some headache and neck pain Missed work at [employer].  In ER telemetry normal as was ECG  CT head negative except left temporal scalp laceration  Mild headache since  She does not recall events coming out of shower.  Grandson found her and a neighbor took Her to ER No previous epidsodes No family history of sudden death.  Has elevated cholesterol she tries to Rx with diet.  Long standing fibromyalgia on cymbalt   ROS: Denies fever, malais, weight loss, blurry vision, decreased visual acuity, cough, sputum, SOB, hemoptysis, pleuritic pain, palpitaitons, heartburn, abdominal pain, melena, lower extremity edema, claudication, or rash.  All other systems reviewed and negative   General: Affect appropriate Healthy:  appears stated age HEENT: normal Neck supple with no adenopathy JVP normal no bruits no thyromegaly Lungs clear with no wheezing and good diaphragmatic motion Heart:  S1/S2 no murmur,rub, gallop or click PMI normal Abdomen: benighn, BS positve, no tenderness, no AAA no bruit.  No HSM or HJR Distal pulses intact with no bruits No edema Neuro non-focal Skin warm and dry Small laceration just anterior to left temporal area healed well No muscular weakness  Medications Current Outpatient Prescriptions  Medication Sig Dispense Refill  . oxyCODONE-acetaminophen (PERCOCET/ROXICET) 5-325 MG per tablet Take 1-2 tablets by mouth every 4 (four) hours as needed for severe pain.  12 tablet  0   No current facility-administered medications for this visit.    Allergies Sulfa antibiotics  Family History: Family History  Problem Relation Age of Onset  .  Diabetes Father   . Hepatitis Mother   . Hypertension Mother   . Diabetes Brother     Social History: History   Social History  . Marital Status: Divorced    Spouse Name: N/A    Number of Children: N/A  . Years of Education: N/A   Occupational History  . Not on file.   Social History Main Topics  . Smoking status: Current Every Day Smoker  . Smokeless tobacco: Not on file  . Alcohol Use: No  . Drug Use:   . Sexual Activity:    Other Topics Concern  . Not on file   Social History Narrative  . No narrative on file    Electrocardiogram:  11/20  SR rate 91 normal   Assessment and Plan

## 2013-05-11 ENCOUNTER — Other Ambulatory Visit (HOSPITAL_COMMUNITY): Payer: BC Managed Care – PPO

## 2013-05-24 ENCOUNTER — Other Ambulatory Visit (HOSPITAL_COMMUNITY): Payer: BC Managed Care – PPO

## 2013-06-14 ENCOUNTER — Ambulatory Visit (HOSPITAL_COMMUNITY)
Admission: RE | Admit: 2013-06-14 | Discharge: 2013-06-14 | Disposition: A | Discharge status: Home / Self Care | Payer: BC Managed Care – PPO | Source: Ambulatory Visit | Attending: Family Medicine | Admitting: Family Medicine

## 2013-06-14 DIAGNOSIS — Z1231 Encounter for screening mammogram for malignant neoplasm of breast: Secondary | ICD-10-CM | POA: Insufficient documentation

## 2013-08-12 ENCOUNTER — Encounter: Payer: Self-pay | Admitting: Cardiology

## 2013-08-12 ENCOUNTER — Ambulatory Visit (HOSPITAL_COMMUNITY): Payer: BC Managed Care – PPO | Attending: Cardiology

## 2013-08-12 DIAGNOSIS — R55 Syncope and collapse: Secondary | ICD-10-CM | POA: Insufficient documentation

## 2013-08-12 NOTE — Progress Notes (Signed)
Stress Echocardiogram performed.  

## 2013-10-17 ENCOUNTER — Ambulatory Visit: Payer: Self-pay | Admitting: Podiatry

## 2013-10-27 ENCOUNTER — Encounter: Payer: Self-pay | Admitting: Podiatry

## 2013-10-27 ENCOUNTER — Ambulatory Visit (INDEPENDENT_AMBULATORY_CARE_PROVIDER_SITE_OTHER): Payer: BC Managed Care – PPO | Admitting: Podiatry

## 2013-10-27 VITALS — BP 103/71 | HR 88 | Resp 16 | Ht 65.0 in | Wt 158.0 lb

## 2013-10-27 DIAGNOSIS — B351 Tinea unguium: Secondary | ICD-10-CM

## 2013-10-27 DIAGNOSIS — Z79899 Other long term (current) drug therapy: Secondary | ICD-10-CM

## 2013-10-27 MED ORDER — TERBINAFINE HCL 250 MG PO TABS: 250.0000 mg | ORAL_TABLET | Freq: Every day | ORAL | Status: AC

## 2013-10-27 NOTE — Progress Notes (Signed)
Subjective:     Patient ID: Ashley Gould, female   DOB: [birth year], 53 y.o.   MRN: [id number]  HPI patient presents stating that she has skin is peeling on both feet and nails that are bothering her with thickness difficult to cut and family history of condition   Review of Systems  All other systems reviewed and are negative.      Objective:   Physical Exam  Nursing note and vitals reviewed. Constitutional: She is oriented to person, place, and time.  Cardiovascular: Intact distal pulses.   Musculoskeletal: Normal range of motion.  Neurological: She is oriented to person, place, and time.  Skin: Skin is warm.   neurovascular status intact with muscle strength adequate range of motion of the subtalar midtarsal joint within normal limits. Patient's found to have dry flaky skin plantar aspect of both feet with occasional itching and is found to have severe nail disease 1-5 both feet with yellow debris noted and pain when pressed     Assessment:     Probable fungal infiltration with family history of condition with mycotic nail infection and fungal skin formation    Plan:     H&P and education rendered. Started on Lamisil 250 mg daily after completion of liver function studies which she was given instructions for and dispensed bungee phone and also formula 3 to try to help control condition. Discussed possibility for nail removal are laser in the future

## 2013-10-27 NOTE — Patient Instructions (Signed)
Onychomycosis/Fungal Toenails  WHAT IS IT? An infection that lies within the keratin of your nail plate that is caused by a fungus.  WHY ME? Fungal infections affect all ages, sexes, races, and creeds.  There may be many factors that predispose you to a fungal infection such as age, coexisting medical conditions such as diabetes, or an autoimmune disease; stress, medications, fatigue, genetics, etc.  Bottom line: fungus thrives in a warm, moist environment and your shoes offer such a location.  IS IT CONTAGIOUS? Theoretically, yes.  You do not want to share shoes, nail clippers or files with someone who has fungal toenails.  Walking around barefoot in the same room or sleeping in the same bed is unlikely to transfer the organism.  It is important to realize, however, that fungus can spread easily from one nail to the next on the same foot.  HOW DO WE TREAT THIS?  There are several ways to treat this condition.  Treatment may depend on many factors such as age, medications, pregnancy, liver and kidney conditions, etc.  It is best to ask your doctor which options are available to you.  1. No treatment.   Unlike many other medical concerns, you can live with this condition.  However for many people this can be a painful condition and may lead to ingrown toenails or a bacterial infection.  It is recommended that you keep the nails cut short to help reduce the amount of fungal nail. 2. Topical treatment.  These range from herbal remedies to prescription strength nail lacquers.  About 40-50% effective, topicals require twice daily application for approximately 9 to 12 months or until an entirely new nail has grown out.  The most effective topicals are medical grade medications available through physicians offices. 3. Oral antifungal medications.  With an 80-90% cure rate, the most common oral medication requires 3 to 4 months of therapy and stays in your system for a year as the new nail grows out.  Oral  antifungal medications do require blood work to make sure it is a safe drug for you.  A liver function panel will be performed prior to starting the medication and after the first month of treatment.  It is important to have the blood work performed to avoid any harmful side effects.  In general, this medication safe but blood work is required. 4. Laser Therapy.  This treatment is performed by applying a specialized laser to the affected nail plate.  This therapy is noninvasive, fast, and non-painful.  It is not covered by insurance and is therefore, out of pocket.  The results have been very good with a 80-95% cure rate.  [facility] is the only practice in the area to offer this therapy. 5. Permanent Nail Avulsion.  Removing the entire nail so that a new nail will not grow back.   Athlete's Foot Athlete's foot (tinea pedis) is a contagious fungal skin infection of the feet. Athlete's foot usually affects the skin between the fourth and fifth toes. It is called athlete's foot because it is a common occurrence in athletes. SYMPTOMS   Scales on the feet, most commonly between the toes, that appear moist, soft, gray-white, or red.  Dead skin between the toes.  Itching in the inflamed areas.  Damp, musty foot odor.  Sometimes, small blisters on the feet caused by a hypersensitivity to the fungus. CAUSES  Infection is caused by contact with a fungus or yeast. The fungus or yeast typically resides  in moist socks and shoes because the fungus thrives in dark, moist environments. RISK INCREASES WITH:  Walking barefoot in public places such as bathrooms or showers.  Poor hygiene of the feet such as infrequent washing of the feet or infrequent changing of shoes or socks, or both.  Hot, humid weather.  Forgetting to dry the spaces between your toes. PREVENTION  Follow good locker room hygiene.  Use your own towels.  Wear shoes or sandals.  Wash with warm or hot water and  soap.  Wash your feet daily. Dry thoroughly, especially between the toes. Dust with talcum powder or antifungal powder.  Allow feet to dry out by occasionally walking barefoot.  Change socks daily.  Wear socks made of cotton, wool, or other natural absorbent fibers. Avoid socks made from synthetic fibers. PROGNOSIS  With appropriate treatment, athlete's foot typically resolves within 3 weeks although recurrence is common.  RELATED COMPLICATIONS   Chronic infection or recurrence, especially if not appropriately or completely treated.  Bacterial infection on top of the fungal infection in the affected area (bacterial superinfection or secondary bacterial infection).  Rarely, an allergic autoimmune response to the infection on the hands and face. TREATMENT Initially, keep the affected area dry and cool. Remove the scaly and dead material if it is present. Change socks daily. Walking barefoot or wearing sandals whenever possible helps dry the affected area. Use antifungal powders, creams, or ointments after each bath. If the infection does not respond to topical treatment, contact your health care provider and he or she may prescribe oral antifungal medication. MEDICATION   Non-prescription antifungal creams, ointments, or powders can be used on your feet or toes, or in shoes.  Anti-itch medications may be prescribed as necessary by your health care provider. Use only as directed and only as much as you need.  Oral antifungal medications may be prescribed by your health care provider. Take the entire course of medication as prescribed. SEEK MEDICAL CARE IF:   Symptoms get worse or do not improve in 2 weeks despite treatment.  New, unexplained symptoms develop (drugs used in treatment may produce side effects). Document Released: 04/21/2005 Document Revised: 04/26/2013 Document Reviewed: 08/03/2008 [facility] Patient Information 2015 [place], [place]. This information is not intended to  replace advice given to you by your health care provider. Make sure you discuss any questions you have with your health care provider.

## 2013-10-27 NOTE — Progress Notes (Signed)
   Subjective:    Patient ID: Ashley Gould, female    DOB: 01/14/1961, 53 y.o.   MRN: [id number]  HPI Comments: "I have bad feet"  Patient c/o itching, burning plantar and sides bilateral for 3 years. The skin is peeling and scaly. The toenails are thick and discolored. She has tried several OTC meds-no help.      Review of Systems  Musculoskeletal: Positive for arthralgias, back pain and myalgias.  Skin: Positive for rash.  All other systems reviewed and are negative.      Objective:   Physical Exam        Assessment & Plan:

## 2014-03-02 ENCOUNTER — Ambulatory Visit: Payer: BC Managed Care – PPO | Admitting: Podiatry

## 2022-01-15 ENCOUNTER — Ambulatory Visit (INDEPENDENT_AMBULATORY_CARE_PROVIDER_SITE_OTHER): Payer: No Typology Code available for payment source | Admitting: Mental Health

## 2022-01-15 DIAGNOSIS — F331 Major depressive disorder, recurrent, moderate: Secondary | ICD-10-CM | POA: Diagnosis not present

## 2022-01-15 NOTE — Progress Notes (Signed)
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 Reports a history of therapy through the [place], diagnosis history of depression, anxiety and PTSD.  She has custody of her grandson since 2006. She has started the process of applying for SSDI this year; she has noticed her anxiety increasing this year, she had to park her car and get a LYFT. She knows will be brought up going through the process. She has been coping with anxiety and depression since [place]. Her daughter has had chronic addiction challenges, which led to her caring for her grandson. She has a son but this relationship is often strained.  She reports depression is primary, anxiety secondary. Anxiety while driving longer distances. Reports crying spells, has to force herself to shower, go to work. States "I dont know what I like anymore, what I'm interested in, I dont go out as much". Getting about 5 hours/night, wakes often at 3 or 4am.  Recommended she return to therapy and continue to follow up with doctor appointments.    Mental Status Exam:    Appearance:    Casual     Behavior:   Appropriate  Motor:   WNL  Speech/Language:    Clear and Coherent  Affect:   Full range   Mood:   depressed  Thought process:   Logical, linear, goal directed  Thought content:     WNL  Sensory/Perceptual disturbances:     none  Orientation:   x4  Attention:   Good  Concentration:   Good  Memory:   Intact  Fund of knowledge:    Consistent with age and development  Insight:     Good  Judgment:    Good  Impulse Control:   Good     Reported Symptoms:  sleep problems, depression, anxiety  Risk Assessment: Danger to Self:  No Self-injurious Behavior: No Danger to Others: No Duty to Warn:no Physical Aggression /  Violence:No  Access to Firearms a concern: No  Gang Involvement:No  Patient / guardian was educated about steps to take if suicide or homicide risk level increases between visits: yes While future psychiatric events cannot be accurately predicted, the patient does not currently require acute inpatient psychiatric care and does not currently meet [facility] involuntary commitment criteria.  Substance Abuse History: Current substance abuse: No     Medical History/Surgical History: Past Medical History:  Diagnosis Date   Fibromyalgia    GERD (gastroesophageal reflux disease)    Hiatal hernia       Medications: Current Outpatient Medications  Medication Sig Dispense Refill   DULoxetine (CYMBALTA) 30 MG capsule Take 30 mg by mouth daily.     esomeprazole (NEXIUM) 20 MG capsule Take 40 mg by mouth daily at 12 noon.     terbinafine (LAMISIL) 250 MG tablet Take 1 tablet (250 mg total) by mouth daily. 90 tablet 0   No current facility-administered medications for this visit.    Allergies  Allergen Reactions   Sulfa Antibiotics Anaphylaxis and Hives    Diagnoses:    ICD-10-CM   1. MDD (major depressive disorder), recurrent episode, moderate (HCC)  F33.1       Plan of Care: TBD   Waldron Session, [facility]

## 2022-02-06 ENCOUNTER — Ambulatory Visit (INDEPENDENT_AMBULATORY_CARE_PROVIDER_SITE_OTHER): Payer: No Typology Code available for payment source | Admitting: Mental Health

## 2022-02-06 DIAGNOSIS — F331 Major depressive disorder, recurrent, moderate: Secondary | ICD-10-CM

## 2022-02-06 DIAGNOSIS — F4310 Post-traumatic stress disorder, unspecified: Secondary | ICD-10-CM
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
## 2022-02-27 ENCOUNTER — Ambulatory Visit (INDEPENDENT_AMBULATORY_CARE_PROVIDER_SITE_OTHER): Payer: No Typology Code available for payment source | Admitting: Mental Health

## 2022-02-27 DIAGNOSIS — F331 Major depressive disorder, recurrent, moderate: Secondary | ICD-10-CM | POA: Diagnosis not present
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
## 2022-04-08 ENCOUNTER — Ambulatory Visit (INDEPENDENT_AMBULATORY_CARE_PROVIDER_SITE_OTHER): Payer: No Typology Code available for payment source | Admitting: Mental Health

## 2022-04-08 DIAGNOSIS — F331 Major depressive disorder, recurrent, moderate: Secondary | ICD-10-CM

## 2022-04-08 NOTE — Progress Notes (Signed)
[facility] Psychotherapy Note Name: Ashley Gould Date: 04/08/2022 MRN: [id number] DOB: Feb 22, 1961 PCP: Merri Brunette, MD  Time spent: 46 minutes  Treatment: ind. Therapy  Virtual Visit via Telehealth Note Connected with patient by a telemedicine/telehealth application, with their informed consent, and verified patient privacy and that I am speaking with the correct person using two identifiers. I discussed the limitations, risks, security and privacy concerns of performing psychotherapy and the availability of in person appointments. I also discussed with the patient that there may be a patient responsible charge related to this service. The patient expressed understanding and agreed to proceed. I discussed the treatment planning with the patient. The patient was provided an opportunity to ask questions and all were answered. The patient agreed with the plan and demonstrated an understanding of the instructions. The patient was advised to call  our office if  symptoms worsen or feel they are in a crisis state and need immediate contact.   Therapist Location: office Patient Location: home    Mental Status Exam:    Appearance:    Casual     Behavior:   Appropriate  Motor:   WNL  Speech/Language:    Clear and Coherent  Affect:   Full range   Mood:   Euthymic  Thought process:   Logical, linear   Thought content:     WNL  Sensory/Perceptual disturbances:     none  Orientation:   x4  Attention:   Good  Concentration:   Good  Memory:   Intact  Fund of knowledge:    Consistent with age and development  Insight:     Good  Judgment:    Good  Impulse Control:   Good     Reported Symptoms:  sleep problems, depression, anxiety, intrusive thoughts, rumination, negative thoughts, chronic emotional distress  Risk Assessment: Danger to Self:  No Self-injurious Behavior: No Danger to Others: No Duty to Warn:no Physical Aggression / Violence:No  Access to Firearms a concern:  No  Gang Involvement:No  Patient / guardian was educated about steps to take if suicide or homicide risk level increases between visits: yes While future psychiatric events cannot be accurately predicted, the patient does not currently require acute inpatient psychiatric care and does not currently meet [facility] involuntary commitment criteria.     Subjective: Patient engaged in telehealth session via video.  Assessed events since last visit, progress.  She shared how she started lexapro from the [place], some side effects but plans to cont as she consulted w/ her doctor and has a follow-up appointment 3 weeks.  Assessed recent mood where she described it as having a "somber mood" due to her grandson being away as he continues to live with his grandfather.  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  She went on to share how she continues to have a strained relationship with her youngest brother, they have not spoken in the last 2 years.  She does have contact with one of her sisters who lives about an hour away, continues to miss her oldest sister that passed away about 4 years ago.  Facilitated her identifying how she is coping with a focus on thoughts where she was able to identify how she cannot control her youngest brothers actions, that she would like to have contact with him but knows that she is made attempts.  Explored interests  where she plans to try and engage in some of her crafts, painting excetra as she got the materials out recently but has yet to start.  Enjoys gardening as well but due to the recent seasonal change this has been less often.    Interventions:   Supportive therapy, motivational interviewing  Diagnoses:    ICD-10-CM   1. MDD (major depressive disorder), recurrent episode, moderate (HCC)  F33.1            Long-Term Treatment Goals: Reduce the severity  and frequency symptoms per patient report for at least 3 months consecutively. Improve the patient's ability to manage stress and anxiety, resulting in an increase in overall well-being and a decrease in symptoms for at least 3 months consecutively.      Short-Term Treatment Goals: Increase the patient's ability to cope with stress and anxiety by utilizing coping skills/strategies such as relaxation techniques and mindfulness-based stress reduction. Increase identification and awareness of thoughts/beliefs that increase and maintain her feelings of depression and anxiety. 3.   Continue to attend doctor appointments and consult with her doctors as needed.    Waldron Session, [facility]

## 2022-04-25 ENCOUNTER — Ambulatory Visit: Payer: No Typology Code available for payment source | Admitting: Mental Health

## 2022-04-25 NOTE — Progress Notes (Incomplete)
[facility] Psych[facility] Note Name: Ashley Gould Date: 04/25/2022 MRN: [id number] DOB: 04-19-1961 PCP: Merri Brunette, MD  Time spent: 47 minutes  Treatment: ind. Therapy  Virtual Visit via Telehealth Note Connected with patient by a telemedicine/telehealth application, with their informed consent, and verified patient privacy and that I am speaking with the correct person using two identifiers. I discussed the limitations, risks, security and privacy concerns of performing psychotherapy and the availability of in person appointments. I also discussed with the patient that there may be a patient responsible charge related to this service. The patient expressed understanding and agreed to proceed. I discussed the treatment planning with the patient. The patient was provided an opportunity to ask questions and all were answered. The patient agreed with the plan and demonstrated an understanding of the instructions. The patient was advised to call  our office if  symptoms worsen or feel they are in a crisis state and need immediate contact.   Therapist Location: office Patient Location: home    Mental Status Exam:    Appearance:    Casual     Behavior:   Appropriate  Motor:   WNL  Speech/Language:    Clear and Coherent  Affect:   Full range   Mood:   Euthymic  Thought process:   Logical, linear   Thought content:     WNL  Sensory/Perceptual disturbances:     none  Orientation:   x4  Attention:   Good  Concentration:   Good  Memory:   Intact  Fund of knowledge:    Consistent with age and development  Insight:     Good  Judgment:    Good  Impulse Control:   Good     Reported Symptoms:  sleep problems, depression, anxiety, intrusive thoughts, rumination, negative thoughts, chronic emotional distress  Risk Assessment: Danger to Self:  No Self-injurious Behavior: No Danger to Others: No Duty to Warn:no Physical Aggression / Violence:No  Access to Firearms a concern:  No  Gang Involvement:No  Patient / guardian was educated about steps to take if suicide or homicide risk level increases between visits: yes While future psychiatric events cannot be accurately predicted, the patient does not currently require acute inpatient psychiatric care and does not currently meet [facility] involuntary commitment criteria.     Subjective: Patient engaged in telehealth session via video.  Assessed events since last visit, progress.  She shared how she started lexapro from the [place], some side effects but plans to cont as she consulted w/ her doctor and has a follow-up appointment 3 weeks.  Assessed recent mood where she described it as having a "somber mood" due to her grandson being away as he continues to live with his grandfather.  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  She went on to share how she continues to have a strained relationship with her youngest brother, they have not spoken in the last 2 years.  She does have contact with one of her sisters who lives about an hour away, continues to miss her oldest sister that passed away about 4 years ago.  Facilitated her identifying how she is coping with a focus on thoughts where she was able to identify how she cannot control her youngest brothers actions, that she would like to have contact with him but knows that she is made attempts.  Explored interests  where she plans to try and engage in some of her crafts, painting excetra as she got the materials out recently but has yet to start.  Enjoys gardening as well but due to the recent seasonal change this has been less often.    Interventions:   Supportive therapy, motivational interviewing  Diagnoses:  No diagnosis found.        Long-Term Treatment Goals: Reduce the severity and frequency symptoms per patient report for at least 3 months  consecutively. Improve the patient's ability to manage stress and anxiety, resulting in an increase in overall well-being and a decrease in symptoms for at least 3 months consecutively.      Short-Term Treatment Goals: Increase the patient's ability to cope with stress and anxiety by utilizing coping skills/strategies such as relaxation techniques and mindfulness-based stress reduction. Increase identification and awareness of thoughts/beliefs that increase and maintain her feelings of depression and anxiety. 3.   Continue to attend doctor appointments and consult with her doctors as needed.    Waldron Session, [facility]

## 2022-05-01 NOTE — Progress Notes (Signed)
Late CN, no chg

## 2022-05-09 ENCOUNTER — Ambulatory Visit: Payer: No Typology Code available for payment source | Admitting: Mental Health

## 2022-05-09 DIAGNOSIS — F489 Nonpsychotic mental disorder, unspecified: Secondary | ICD-10-CM

## 2022-05-09 NOTE — Progress Notes (Signed)
Late CN no chg.

## 2022-05-09 NOTE — Progress Notes (Deleted)
[facility] Psych[facility] Note Name: Ashley Gould Date: 05/09/2022 MRN: [id number] DOB: 05/30/1960 PCP: Carol Ada, MD  Time spent: 46 minutes  Treatment: ind. Therapy  Virtual Visit via Telehealth Note Connected with patient by a telemedicine/telehealth application, with their informed consent, and verified patient privacy and that I am speaking with the correct person using two identifiers. I discussed the limitations, risks, security and privacy concerns of performing psychotherapy and the availability of in person appointments. I also discussed with the patient that there may be a patient responsible charge related to this service. The patient expressed understanding and agreed to proceed. I discussed the treatment planning with the patient. The patient was provided an opportunity to ask questions and all were answered. The patient agreed with the plan and demonstrated an understanding of the instructions. The patient was advised to call  our office if  symptoms worsen or feel they are in a crisis state and need immediate contact.   Therapist Location: office Patient Location: home    Mental Status Exam:    Appearance:    Casual     Behavior:   Appropriate  Motor:   WNL  Speech/Language:    Clear and Coherent  Affect:   Full range   Mood:   Euthymic  Thought process:   Logical, linear   Thought content:     WNL  Sensory/Perceptual disturbances:     none  Orientation:   x4  Attention:   Good  Concentration:   Good  Memory:   Intact  Fund of knowledge:    Consistent with age and development  Insight:     Good  Judgment:    Good  Impulse Control:   Good     Reported Symptoms:  sleep problems, depression, anxiety, intrusive thoughts, rumination, negative thoughts, chronic emotional distress  Risk Assessment: Danger to Self:  No Self-injurious Behavior: No Danger to Others: No Duty to Warn:no Physical Aggression / Violence:No  Access to Firearms a concern:  No  Gang Involvement:No  Patient / guardian was educated about steps to take if suicide or homicide risk level increases between visits: yes While future psychiatric events cannot be accurately predicted, the patient does not currently require acute inpatient psychiatric care and does not currently meet [facility] involuntary commitment criteria.     Subjective: Patient engaged in telehealth session via video.     Assessed events since last visit, progress.  She shared how she started lexapro from the [place], some side effects but plans to cont as she consulted w/ her doctor and has a follow-up appointment 3 weeks.  Assessed recent mood where she described it as having a "somber mood" due to her grandson being away as he continues to live with his grandfather.  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  She went on to share how she continues to have a strained relationship with her youngest brother, they have not spoken in the last 2 years.  She does have contact with one of her sisters who lives about an hour away, continues to miss her oldest sister that passed away about 4 years ago.  Facilitated her identifying how she is coping with a focus on thoughts where she was able to identify how she cannot control her youngest brothers actions, that she would like to have contact with him but knows that she is made attempts.  Explored interests where she plans to try and engage in some of her crafts, painting excetra as she got the materials out recently but has yet to start.  Enjoys gardening as well but due to the recent seasonal change this has been less often.    Interventions:   Supportive therapy, motivational interviewing  Diagnoses:  No diagnosis found.    Long-Term Treatment Goals: Reduce the severity and frequency symptoms per patient report for at least 3 months  consecutively. Improve the patient's ability to manage stress and anxiety, resulting in an increase in overall well-being and a decrease in symptoms for at least 3 months consecutively.      Short-Term Treatment Goals: Increase the patient's ability to cope with stress and anxiety by utilizing coping skills/strategies such as relaxation techniques and mindfulness-based stress reduction. Increase identification and awareness of thoughts/beliefs that increase and maintain her feelings of depression and anxiety. 3.   Continue to attend doctor appointments and consult with her doctors as needed.    Anson Oregon, [facility]

## 2022-05-23 ENCOUNTER — Ambulatory Visit (INDEPENDENT_AMBULATORY_CARE_PROVIDER_SITE_OTHER): Payer: No Typology Code available for payment source | Admitting: Mental Health

## 2022-05-23 DIAGNOSIS — F331 Major depressive disorder, recurrent, moderate: Secondary | ICD-10-CM | POA: Diagnosis not present

## 2022-05-23 NOTE — Progress Notes (Signed)
[facility] Psychotherapy Note Name: Ashley Gould Date: 05/23/2022 MRN: [id number] DOB: 1960-07-02 PCP: Carol Ada, MD  Time spent: 55 minutes  Treatment: ind. Therapy  Virtual Visit via Telehealth Note Connected with patient by a telemedicine/telehealth application, with their informed consent, and verified patient privacy and that I am speaking with the correct person using two identifiers. I discussed the limitations, risks, security and privacy concerns of performing psychotherapy and the availability of in person appointments. I also discussed with the patient that there may be a patient responsible charge related to this service. The patient expressed understanding and agreed to proceed. I discussed the treatment planning with the patient. The patient was provided an opportunity to ask questions and all were answered. The patient agreed with the plan and demonstrated an understanding of the instructions. The patient was advised to call  our office if  symptoms worsen or feel they are in a crisis state and need immediate contact.   Therapist Location: office Patient Location: home    Mental Status Exam:    Appearance:    Casual     Behavior:   Appropriate  Motor:   WNL  Speech/Language:    Clear and Coherent  Affect:   Full range   Mood:   Euthymic  Thought process:   Logical, linear   Thought content:     WNL  Sensory/Perceptual disturbances:     none  Orientation:   x4  Attention:   Good  Concentration:   Good  Memory:   Intact  Fund of knowledge:    Consistent with age and development  Insight:     Good  Judgment:    Good  Impulse Control:   Good     Reported Symptoms:  sleep problems, depression, anxiety, intrusive thoughts, rumination, negative thoughts, chronic emotional distress  Risk Assessment: Danger to Self:  No Self-injurious Behavior: No Danger to Others: No Duty to Warn:no Physical Aggression / Violence:No  Access to Firearms a concern:  No  Gang Involvement:No  Patient / guardian was educated about steps to take if suicide or homicide risk level increases between visits: yes While future psychiatric events cannot be accurately predicted, the patient does not currently require acute inpatient psychiatric care and does not currently meet [facility] involuntary commitment criteria.     Subjective: Patient engaged in telehealth session via video.  Assess progress and events since last visit.  Patient shared ongoing relational stress related to her 2 children.  She stated that over the holidays that they did not call her, or on Christmas Day specifically.  She shared how she is typically is the one who reaches out in relationships, both of which are strained and have been for the past few years.  She stated that her daughter, who has struggled with addiction issues and other legal challenges, is currently incarcerated in jail and is awaiting her court hearing.  She identified ways she has set some boundaries in this relationship as to not enable her daughter as this has been a pattern of behavior regarding her legal issues.  Provide support and validation as she shared the struggle in their relationship, patient taking responsibility in raising her daughter some for the past 18 years.  Through guided discovery, she further identified the need to maintain a sense of boundaries in the relationship with her son going on to share some of the complexities of the relationship as a result of his having children with 4 different women and patient's attempts  to navigate relationships with her grandchildren while also with her sons ex girlfriend as well as her son himself.  She stated she recently came to understand that she has been diagnosed with depression, anxiety many years ago per her records in the [employer] however, she stated she was unaware of this (like to known to get further treatment.  She has an approaching appointment with her  prescribing physician and we encouraged her to further discuss her sleep challenges with her doctor as this is a chronic issue.  We discussed some coping skills to improve getting back to sleep at night.   Interventions:   Supportive therapy, motivational interviewing, problem solving  Diagnoses:    ICD-10-CM   1. MDD (major depressive disorder), recurrent episode, moderate (HCC)  F33.1             Long-Term Treatment Goals: Reduce the severity and frequency symptoms per patient report for at least 3 months consecutively. Improve the patient's ability to manage stress and anxiety, resulting in an increase in overall well-being and a decrease in symptoms for at least 3 months consecutively.      Short-Term Treatment Goals: Increase the patient's ability to cope with stress and anxiety by utilizing coping skills/strategies such as relaxation techniques and mindfulness-based stress reduction. Increase identification and awareness of thoughts/beliefs that increase and maintain her feelings of depression and anxiety. Improve sleep patterns toward getting consistent needed rest. 4.   Continue to attend doctor appointments and consult with her doctors as needed.    Anson Oregon, [facility]

## 2022-06-09 ENCOUNTER — Ambulatory Visit (INDEPENDENT_AMBULATORY_CARE_PROVIDER_SITE_OTHER): Payer: No Typology Code available for payment source | Admitting: Mental Health

## 2022-06-09 DIAGNOSIS — F331 Major depressive disorder, recurrent, moderate: Secondary | ICD-10-CM | POA: Diagnosis not present

## 2022-06-09 NOTE — Progress Notes (Signed)
[facility] Psych[facility] Note Name: EMMAGRACE RUNKEL Date: 06/09/2022 MRN: [id number] DOB: 1961/01/23 PCP: Carol Ada, MD  Time spent: 52 minutes  Treatment: ind. Therapy  Virtual Visit via Telehealth Note Connected with patient by a telemedicine/telehealth application, with their informed consent, and verified patient privacy and that I am speaking with the correct person using two identifiers. I discussed the limitations, risks, security and privacy concerns of performing psychotherapy and the availability of in person appointments. I also discussed with the patient that there may be a patient responsible charge related to this service. The patient expressed understanding and agreed to proceed. I discussed the treatment planning with the patient. The patient was provided an opportunity to ask questions and all were answered. The patient agreed with the plan and demonstrated an understanding of the instructions. The patient was advised to call  our office if  symptoms worsen or feel they are in a crisis state and need immediate contact.   Therapist Location: office Patient Location: home    Mental Status Exam:    Appearance:    Casual     Behavior:   Appropriate  Motor:   WNL  Speech/Language:    Clear and Coherent  Affect:   Full range   Mood:   Depressed  Thought process:   Logical, linear   Thought content:     WNL  Sensory/Perceptual disturbances:     none  Orientation:   x4  Attention:   Good  Concentration:   Good  Memory:   Intact  Fund of knowledge:    Consistent with age and development  Insight:     Good  Judgment:    Good  Impulse Control:   Good     Reported Symptoms:  sleep problems, depression, anxiety, intrusive thoughts, rumination, negative thoughts, chronic emotional distress  Risk Assessment: Danger to Self:  No Self-injurious Behavior: No Danger to Others: No Duty to Warn:no Physical Aggression / Violence:No  Access to Firearms a concern:  No  Gang Involvement:No  Patient / guardian was educated about steps to take if suicide or homicide risk level increases between visits: yes While future psychiatric events cannot be accurately predicted, the patient does not currently require acute inpatient psychiatric care and does not currently meet [facility] involuntary commitment criteria.     Subjective: Patient engaged in telehealth session via video.   Assess progress since last visit.  Explore progress related to her mood where patient identified feeling alone often.  She stated that her son and daughter have out of town as well as one of her closer friends she has known for 20 years.  She stated that if she ever had an emergency she does not know who she would call.  Facilitated her reframing some thoughts where she was able to do so with a focus on how she can call her friend of 20 years and they would be there for her if needed.  She says she does not want to be inconvenience and knows that he is going through a lot at this time, however, patient also acknowledges her tendency to be there For others as opposed to not wanting to burden others.  She stated that her daughter calls most days, continues to cope with significant legal issues.  She stated her son has moved back to [place] but did not follow through with plans to move back in with her recently and at this point, she is unaware of his plans as he has  not communicated with her.  She stated she learned that her brother and wife are expecting a baby but due to their not having contact, she was informed by someone else.  She identifies ongoing struggles with motivation, stated that she often does not complete all tasks at home and does not engage in enjoyable activities such as herself.  Explored ways to cope, discussed some ways to become behavioral activated, she plans to follow through by focusing on one of her smaller bedrooms toward cleaning and  organizing.   Interventions:   Supportive therapy, motivational interviewing, problem solving  Diagnoses:    ICD-10-CM   1. MDD (major depressive disorder), recurrent episode, moderate (HCC)  F33.1              Long-Term Treatment Goals: Reduce the severity and frequency symptoms per patient report for at least 3 months consecutively. Improve the patient's ability to manage stress and anxiety, resulting in an increase in overall well-being and a decrease in symptoms for at least 3 months consecutively.      Short-Term Treatment Goals: Increase the patient's ability to cope with stress and anxiety by utilizing coping skills/strategies such as relaxation techniques and mindfulness-based stress reduction. Increase identification and awareness of thoughts/beliefs that increase and maintain her feelings of depression and anxiety. Improve sleep patterns toward getting consistent needed rest. 4.   Continue to attend doctor appointments and consult with her doctors as needed.    Anson Oregon, [facility]

## 2022-06-23 ENCOUNTER — Ambulatory Visit: Payer: No Typology Code available for payment source | Admitting: Mental Health

## 2022-06-23 DIAGNOSIS — F331 Major depressive disorder, recurrent, moderate: Secondary | ICD-10-CM | POA: Diagnosis not present

## 2022-06-23 DIAGNOSIS — F4310 Post-traumatic stress disorder, unspecified: Secondary | ICD-10-CM

## 2022-06-23 NOTE — Progress Notes (Signed)
[facility] Psychotherapy Note Name: Ashley Gould Date: 06/23/2022 MRN: [id number] DOB: 01-11-1961 PCP: Carol Ada, MD  Time spent: 52 minutes  Treatment: ind. Therapy  Virtual Visit via Telehealth Note Connected with patient by a telemedicine/telehealth application, with their informed consent, and verified patient privacy and that I am speaking with the correct person using two identifiers. I discussed the limitations, risks, security and privacy concerns of performing psychotherapy and the availability of in person appointments. I also discussed with the patient that there may be a patient responsible charge related to this service. The patient expressed understanding and agreed to proceed. I discussed the treatment planning with the patient. The patient was provided an opportunity to ask questions and all were answered. The patient agreed with the plan and demonstrated an understanding of the instructions. The patient was advised to call  our office if  symptoms worsen or feel they are in a crisis state and need immediate contact.   Therapist Location: office Patient Location: home    Mental Status Exam:    Appearance:    Casual     Behavior:   Appropriate  Motor:   WNL  Speech/Language:    Clear and Coherent  Affect:   Full range   Mood:   Depressed  Thought process:   Logical, linear   Thought content:     WNL  Sensory/Perceptual disturbances:     none  Orientation:   x4  Attention:   Good  Concentration:   Good  Memory:   Intact  Fund of knowledge:    Consistent with age and development  Insight:     Good  Judgment:    Good  Impulse Control:   Good     Reported Symptoms:  sleep problems, depression, anxiety, intrusive thoughts, rumination, negative thoughts, chronic emotional distress  Risk Assessment: Danger to Self:  No Self-injurious Behavior: No Danger to Others: No Duty to Warn:no Physical Aggression / Violence:No  Access to Firearms a concern:  No  Gang Involvement:No  Patient / guardian was educated about steps to take if suicide or homicide risk level increases between visits: yes While future psychiatric events cannot be accurately predicted, the patient does not currently require acute inpatient psychiatric care and does not currently meet [facility] involuntary commitment criteria.     Subjective: Patient engaged in telehealth session via video.   Assess progress since last visit.  Was able to clean her kitchen yesterday and utilize the cognitive skills discussed last session. Her daughter checked herself in a 90 day program and is back on her medication.  Patient is hopeful that her daughter will follow through with continuing to make positive life changes. 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  She stated that he was supportive, understanding. She went on to share how she can feel anxious and have past thoughts related to her trauma at that time resurface randomly, such as if she is watching something on television that is somehow related.  She stated she plans to engage in Pilates course and yoga at a local fitness center.  We explored coping, discussed in detail diaphragmatic breathing with mindfulness concepts.  Patient plans to utilize between visits.    Interventions:   Supportive therapy, motivational interviewing, problem solving  Diagnoses:    ICD-10-CM   1. MDD (major depressive disorder), recurrent episode, moderate (HCC)  F33.1  Long-Term Treatment Goals: Reduce the severity and frequency symptoms per patient report for at least 3 months consecutively. Improve the patient's ability to manage stress and anxiety, resulting in an increase in overall well-being and a decrease in symptoms for at least 3 months consecutively.      Short-Term Treatment Goals: Increase the patient's ability to cope with  stress and anxiety by utilizing coping skills/strategies such as relaxation techniques and mindfulness-based stress reduction. Increase identification and awareness of thoughts/beliefs that increase and maintain her feelings of depression and anxiety. Improve sleep patterns toward getting consistent needed rest. 4.   Continue to attend doctor appointments and consult with her doctors as needed.    [place], [facility]

## 2022-07-09 ENCOUNTER — Ambulatory Visit (INDEPENDENT_AMBULATORY_CARE_PROVIDER_SITE_OTHER): Payer: Self-pay | Admitting: Mental Health

## 2022-07-09 DIAGNOSIS — F489 Nonpsychotic mental disorder, unspecified: Secondary | ICD-10-CM

## 2022-07-09 DIAGNOSIS — F331 Major depressive disorder, recurrent, moderate: Secondary | ICD-10-CM

## 2022-07-09 NOTE — Progress Notes (Signed)
n
